# Patient Record
Sex: Female | Born: 1975 | Race: Black or African American | Hispanic: No | Marital: Married | Smoking: Never smoker
Health system: Southern US, Community
[De-identification: ages and names within clinical notes are randomized; demographics above are authoritative.]

---

## 1998-04-04 ENCOUNTER — Observation Stay (HOSPITAL_COMMUNITY): Admission: EM | Admit: 1998-04-04 | Discharge: 1998-04-05 | Payer: Self-pay | Admitting: Emergency Medicine

## 2000-09-22 ENCOUNTER — Emergency Department (HOSPITAL_COMMUNITY): Admission: EM | Admit: 2000-09-22 | Discharge: 2000-09-22 | Payer: Self-pay | Admitting: Emergency Medicine

## 2005-03-15 ENCOUNTER — Emergency Department (HOSPITAL_COMMUNITY): Admission: EM | Admit: 2005-03-15 | Discharge: 2005-03-15 | Payer: Self-pay | Admitting: *Deleted

## 2007-03-11 ENCOUNTER — Inpatient Hospital Stay (HOSPITAL_COMMUNITY): Admission: AD | Admit: 2007-03-11 | Discharge: 2007-03-11 | Payer: Self-pay | Admitting: Obstetrics and Gynecology

## 2007-03-12 ENCOUNTER — Inpatient Hospital Stay (HOSPITAL_COMMUNITY): Admission: AD | Admit: 2007-03-12 | Discharge: 2007-03-16 | Payer: Self-pay | Admitting: Obstetrics and Gynecology

## 2007-03-13 ENCOUNTER — Encounter (INDEPENDENT_AMBULATORY_CARE_PROVIDER_SITE_OTHER): Payer: Self-pay | Admitting: Obstetrics and Gynecology

## 2010-11-24 NOTE — Op Note (Signed)
Amanda Reese, Amanda Reese NO.:  [id number]   MEDICAL RECORD NO.:  [id number]          PATIENT TYPE:  INP   LOCATION:  9102                          FACILITY:  [facility]   PHYSICIAN:  Duke Salvia. Marcelle Overlie, M.D.DATE OF BIRTH:  April 09, 1976   DATE OF PROCEDURE:  03/13/2007  DATE OF DISCHARGE:                               OPERATIVE REPORT   PREOPERATIVE DIAGNOSIS:  Cephalopelvic disproportion.   POSTOPERATIVE DIAGNOSIS:  Cephalopelvic disproportion plus straight OP  presentation.   PROCEDURE:  Primary low transverse cesarean section.   SURGEON:  Duke Salvia. Marcelle Overlie, M.D.   ANESTHESIA:  Epidural.   COMPLICATIONS:  None.   DRAINS:  Foley catheter.   BLOOD LOSS:  800.   PROCEDURE AND FINDINGS:  The patient was taken to the operating room.  After an adequate level of epidural anesthesia was obtained with the  patient supine, the abdomen prepped and draped in the usual manner for  sterile abdominal procedures.  Foley catheter positioned draining clear  urine.  Transverse Pfannenstiel incision made two fingerbreadths below  the symphysis, carried down to the fascia which was incised and extended  transversely.  Rectus muscles divided midline.  Peritoneum entered  superiorly without incident and extended in a vertical fashion.  The  vesicouterine serosa was then incised and the bladder was bluntly and  sharply dissected below and a bladder blade repositioned.  Transverse  incision was then made in the lower segment, extended with blunt  dissection.  The infant was noted be straight OP, was elevated from the  pelvis and manually rotated to effect easy delivery of female, Apgars were  9 and 9.  The infant was suctioned, cord clamped and passed to pediatric  team for further care.  Cord pH was sent.  Placenta was removed manually  intact, uterus exteriorized, cavity wiped clean with laparotomy pack.  Closure obtained in first layer with 0 chromic in locked fashion  followed by  imbricating layer of 0 chromic.  This was hemostatic.  The  bladder flap area was intact and hemostatic.  Tubes and ovaries were  normal.  Prior to closure sponge, instrument counts were reported as  correct x2.  Perineum closed with a running 2-0 Vicryl suture.  Rectus  muscles closed with interrupted 2-0 Vicryl sutures.  Fascia closed from  laterally to midline on either side with a 0  PDS suture.  Subcutaneous fat was hemostatic.  Clips and Steri-Strips  used on the skin.  She did receive Ancef 1 gram IV after the cord was  clamped along Pitocin IV.  Clear urine noted at the end of the case.  Mother and baby doing well at that point.      Richard M. Marcelle Overlie, M.D.  Electronically Signed     RM[facility]/MEDQ  D:  03/13/2007  T:  03/13/2007  Job:  [id number]

## 2010-11-26 ENCOUNTER — Other Ambulatory Visit: Payer: Self-pay | Admitting: Obstetrics and Gynecology

## 2010-11-26 DIAGNOSIS — N6310 Unspecified lump in the right breast, unspecified quadrant: Secondary | ICD-10-CM

## 2010-11-27 NOTE — Discharge Summary (Signed)
Amanda Reese, DUTT NO.:  [id number]   MEDICAL RECORD NO.:  [id number]          PATIENT TYPE:  INP   LOCATION:  9102                          FACILITY:  [facility]   PHYSICIAN:  Michelle L. Grewal, M.D.DATE OF BIRTH:  February 02, 1976   DATE OF ADMISSION:  03/13/2007  DATE OF DISCHARGE:  03/16/2007                               DISCHARGE SUMMARY   ADMITTING DIAGNOSES:  1. Intrauterine pregnancy at term.  2. Spontaneous onset of labor.   DISCHARGE DIAGNOSES:  1. Status post low transverse cesarean section secondary to      cephalopelvic disproportion.  2. Viable female infant.   PROCEDURE:  Primary low transverse cesarean section.   REASON FOR ADMISSION:  Please see written H&P.   HOSPITAL COURSE:  The patient is a 34 year old black female gravida 3,  para 0 that was admitted to [facility] at 39-2/7 weeks  estimated gestational age.  The patient was seen in triage area prior to  admission and was noted to have early onset of contraction.  There was  some suspicion of variable decelerations which were corrected with  positional change.  On admission vital signs were stable.  She was  afebrile.  Cervix was noted to be 2 cm dilated, 70% effaced, vertex at -  3 station.  Fetal heart tones were reactive in the 140's.  The patient  was now admitted and was augmented with Pitocin.  The patient did  progress to complete dilatation with vertex at 0 station.  After pushing  for two hours the  patient made no further progress between +1 and +2  station.  Fetal heart tones continued to be reactive in the 150's.  Decision was made to proceed with a primary low transverse cesarean  section for cephalopelvic disproportion.  The patient was then  transferred to the operating room where spinal anesthesia was  administered without difficulty.  Low transverse incision was made with  delivery of a viable female infant weighing 6 pounds 9 ounces, Apgars 4 at  1 minute and 9 at  5 minutes.  Arterial cord pH of 7.32.  The patient  tolerated the procedure well and was taken to the recovery room in  stable condition.  On postoperative day #1 the patient was without  complaint.  Vital signs were stable.  She is afebrile.  Fundus was firm  and nontender.  Abdominal dressings noted to be clean, dry, and intact.  The patient was noted to have good urine output.  Hemoglobin was noted  to be 9.7.  On postoperative day #2 the patient was without complaint.  Vital signs were stable.  She is afebrile.  Abdomen soft.  Fundus firm  and nontender.  Incision was clean, dry, and intact.  She was ambulating  well and tolerating a regular diet without complaints of nausea and  vomiting.  Postoperative day #3 the patient was without complaint.  Vital signs were stable.  Abdomen soft.  Fundus firm and nontender.  Incision was clean, dry, and intact.  Stable vital signsand patient was  later discharged home.   CONDITION ON DISCHARGE:  Stable.  DIET:  Regular as tolerated.   ACTIVITY:  No heavy lifting, no driving x2 weeks, no vaginal entry.   FOLLOW UP:  The patient to follow up in the office in 1-2 weeks for  incision check.  She is to call for temperature greater than 100  degrees, persistent nausea, vomiting, heavy vaginal bleeding and/or  redness or drainage from incisional site.   DISCHARGE MEDICATIONS:  1. Percocet 5/325 #30 one p.o. every 4-6 hours p.r.n.  2. Motrin 600 mg every six hours.  3. Prenatal vitamins one p.o. daily.  4. Colace __________ daily p.r.n.      Julio Sicks, N.P.      Stann Mainland. Vincente Poli, M.D.  Electronically Signed    CC/MEDQ  D:  04/11/2007  T:  04/11/2007  Job:  [id number]   cc:   Marcelino Duster L. Vincente Poli, M.D.  Fax: [phone]

## 2010-12-02 ENCOUNTER — Ambulatory Visit
Admission: RE | Admit: 2010-12-02 | Discharge: 2010-12-02 | Disposition: A | Discharge status: Home / Self Care | Payer: BC Managed Care – PPO | Source: Ambulatory Visit | Attending: Obstetrics and Gynecology | Admitting: Obstetrics and Gynecology

## 2010-12-02 DIAGNOSIS — N6310 Unspecified lump in the right breast, unspecified quadrant: Secondary | ICD-10-CM

## 2011-04-23 LAB — CBC
HCT: 28.2 — ABNORMAL LOW
HCT: 33.9 — ABNORMAL LOW
Hemoglobin: 9.7 — ABNORMAL LOW
Hemoglobin: 11.8 — ABNORMAL LOW
MCHC: 34.7
MCV: 86.1
RBC: 3.27 — ABNORMAL LOW
RBC: 3.94
RDW: 14.4 — ABNORMAL HIGH

## 2011-04-23 LAB — RAPID HIV SCREEN (WH-MAU): Rapid HIV Screen: NONREACTIVE

## 2011-04-23 LAB — RPR: RPR Ser Ql: NONREACTIVE

## 2011-06-26 ENCOUNTER — Emergency Department (HOSPITAL_COMMUNITY)
Admission: EM | Admit: 2011-06-26 | Discharge: 2011-06-26 | Disposition: A | Discharge status: Home / Self Care | Payer: PRIVATE HEALTH INSURANCE | Attending: Emergency Medicine | Admitting: Emergency Medicine

## 2011-06-26 DIAGNOSIS — R6883 Chills (without fever): Secondary | ICD-10-CM | POA: Insufficient documentation

## 2011-06-26 DIAGNOSIS — B9789 Other viral agents as the cause of diseases classified elsewhere: Secondary | ICD-10-CM | POA: Insufficient documentation

## 2011-06-26 DIAGNOSIS — J029 Acute pharyngitis, unspecified: Secondary | ICD-10-CM | POA: Insufficient documentation

## 2011-06-26 DIAGNOSIS — R040 Epistaxis: Secondary | ICD-10-CM | POA: Insufficient documentation

## 2011-06-26 DIAGNOSIS — B349 Viral infection, unspecified: Secondary | ICD-10-CM

## 2011-06-26 DIAGNOSIS — IMO0001 Reserved for inherently not codable concepts without codable children: Secondary | ICD-10-CM | POA: Insufficient documentation

## 2011-06-26 DIAGNOSIS — R599 Enlarged lymph nodes, unspecified: Secondary | ICD-10-CM | POA: Insufficient documentation

## 2011-06-26 DIAGNOSIS — J3489 Other specified disorders of nose and nasal sinuses: Secondary | ICD-10-CM | POA: Insufficient documentation

## 2011-06-26 DIAGNOSIS — R0981 Nasal congestion: Secondary | ICD-10-CM

## 2011-06-26 MED ORDER — OXYMETAZOLINE HCL 0.05 % NA SOLN: 2.0000 | Freq: Two times a day (BID) | NASAL | Status: AC

## 2011-06-26 NOTE — ED Provider Notes (Signed)
History     CSN: [id number] Arrival date & time: 06/26/2011  7:55 AM   First MD Initiated Contact with Patient 06/26/11 [phone]      Chief Complaint  Patient presents with  . Sore Throat    (Consider location/radiation/quality/duration/timing/severity/associated sxs/prior treatment) HPI Comments: Patient reports 5 days ago she developed chills and body aches, sore throat, which has since improved. Patient has also had swollen anterior cervical lymph nodes and nasal congestion.  States she has blown her nose and has seen a few spots of blood and has had some continued spotting of blood following blowing her nose.  It was this epistaxis that concerned her and brought her to the ED.  She has tried a home humidifier and saline solution without improvement.  States her fever has resolved since 4 days ago and she denies headaches or facial pain, SOB.    Patient is a 35 y.o. female presenting with pharyngitis. The history is provided by the patient.  Sore Throat The current episode started in the past 7 days. The problem has been gradually improving.    History reviewed. No pertinent past medical history.  History reviewed. No pertinent past surgical history.  Family History  Problem Relation Age of Onset  . Diabetes Mother   . Hyperlipidemia Mother   . Diabetes Father   . Hyperlipidemia Father     History  Substance Use Topics  . Smoking status: Never Smoker   . Smokeless tobacco: Not on file  . Alcohol Use: 0.6 oz/week    1 Glasses of wine per week    OB History    Grav Para Term Preterm Abortions TAB SAB Ect Mult Living            1      Review of Systems  All other systems reviewed and are negative.    Allergies  Review of patient's allergies indicates no known allergies.  Home Medications   Current Outpatient Rx  Name Route Sig Dispense Refill  . DM-PHENYLEPHRINE-ACETAMINOPHEN 10-5-325 MG/15ML PO LIQD Oral Take 15 mLs by mouth 2 (two) times daily as needed. For  symptoms of flu, congestion, cough. Etc.     Marland Kitchen PSEUDOEPHEDRINE-APAP-DM 56-213-08 MG/30ML PO LIQD Oral Take 15 mLs by mouth 2 (two) times daily as needed. For congestion, cough, symptoms of flu.       BP 119/73  Pulse 87  Temp(Src) 97.7 F (36.5 C) (Oral)  Resp 18  Ht 5\' 9"  (1.753 m)  Wt 140 lb (63.504 kg)  BMI 20.67 kg/m2  SpO2 100%  LMP 06/13/2011  Physical Exam  Nursing note and vitals reviewed. Constitutional: She is oriented to person, place, and time. She appears well-developed and well-nourished.  HENT:  Head: Normocephalic and atraumatic.  Nose: Mucosal edema present. No nasal deformity or nasal septal hematoma. No epistaxis. Right sinus exhibits no maxillary sinus tenderness and no frontal sinus tenderness. Left sinus exhibits no maxillary sinus tenderness and no frontal sinus tenderness.  Mouth/Throat: Uvula is midline and mucous membranes are normal. No uvula swelling. Posterior oropharyngeal erythema present. No oropharyngeal exudate, posterior oropharyngeal edema or tonsillar abscesses.       +anterior cervical lymph nodes slightly enlarged and tender  Neck: Trachea normal, normal range of motion and phonation normal. Neck supple. No tracheal tenderness present. No rigidity. No tracheal deviation and normal range of motion present.  Cardiovascular: Normal rate and regular rhythm.   Pulmonary/Chest: Effort normal and breath sounds normal. No stridor.  Neurological: She is alert  and oriented to person, place, and time.    ED Course  Procedures (including critical care time)  Labs Reviewed - No data to display No results found.   1. Viral illness   2. Nasal congestion       MDM  Patient with viral illness this week, much improved, pt well appearing and afebrile on presentation to ED, concerned about blood in nasal discharge and mild epistaxis, resolved with slight pressure lasting seconds.  No active bleeding, sinuses nontender.          Dillard Cannon [place],  [place] 06/26/11 [phone]

## 2011-06-26 NOTE — ED Notes (Signed)
Flu-like symptoms began on Tuesday and then Thursday developed, sore throat and nasal congestion

## 2011-06-26 NOTE — ED Provider Notes (Signed)
Medical screening examination/treatment/procedure(s) were performed by non-physician practitioner and as supervising physician I was immediately available for consultation/collaboration.   Kishon Garriga R Faustina Gebert, MD 06/26/11 0930 

## 2014-04-30 ENCOUNTER — Ambulatory Visit: Payer: Self-pay | Admitting: Obstetrics and Gynecology

## 2015-05-08 ENCOUNTER — Other Ambulatory Visit: Payer: Self-pay | Admitting: Obstetrics and Gynecology

## 2015-05-08 DIAGNOSIS — Z1231 Encounter for screening mammogram for malignant neoplasm of breast: Secondary | ICD-10-CM

## 2015-05-13 ENCOUNTER — Ambulatory Visit
Admission: RE | Admit: 2015-05-13 | Discharge: 2015-05-13 | Disposition: A | Discharge status: Home / Self Care | Payer: BLUE CROSS/BLUE SHIELD | Source: Ambulatory Visit | Attending: Obstetrics and Gynecology | Admitting: Obstetrics and Gynecology

## 2015-05-13 DIAGNOSIS — Z1231 Encounter for screening mammogram for malignant neoplasm of breast: Secondary | ICD-10-CM | POA: Diagnosis present

## 2015-07-31 ENCOUNTER — Encounter: Payer: Self-pay | Admitting: Emergency Medicine

## 2015-07-31 ENCOUNTER — Emergency Department
Admission: EM | Admit: 2015-07-31 | Discharge: 2015-07-31 | Disposition: A | Discharge status: Home / Self Care | Payer: BLUE CROSS/BLUE SHIELD | Attending: Emergency Medicine | Admitting: Emergency Medicine

## 2015-07-31 ENCOUNTER — Emergency Department: Payer: BLUE CROSS/BLUE SHIELD

## 2015-07-31 DIAGNOSIS — R0602 Shortness of breath: Secondary | ICD-10-CM | POA: Diagnosis not present

## 2015-07-31 DIAGNOSIS — R091 Pleurisy: Secondary | ICD-10-CM | POA: Diagnosis present

## 2015-07-31 DIAGNOSIS — R0789 Other chest pain: Secondary | ICD-10-CM | POA: Insufficient documentation

## 2015-07-31 LAB — BASIC METABOLIC PANEL
ANION GAP: 8 (ref 5–15)
BUN: 11 mg/dL (ref 6–20)
CALCIUM: 9.5 mg/dL (ref 8.9–10.3)
CO2: 27 mmol/L (ref 22–32)
Chloride: 103 mmol/L (ref 101–111)
Creatinine, Ser: 0.84 mg/dL (ref 0.44–1.00)
GFR calc Af Amer: >60 mL/min (ref >60)
GFR calc non Af Amer: >60 mL/min (ref >60)
GLUCOSE: 113 mg/dL — AB (ref 65–99)
Potassium: 3.5 mmol/L (ref 3.5–5.1)
Sodium: 138 mmol/L (ref 135–145)

## 2015-07-31 LAB — CBC
HCT: 42.2 % (ref 35.0–47.0)
HEMOGLOBIN: 13.7 g/dL (ref 12.0–16.0)
MCH: 28.7 pg (ref 26.0–34.0)
MCHC: 32.6 g/dL (ref 32.0–36.0)
MCV: 88 fL (ref 80.0–100.0)
PLATELETS: 273 10*3/uL (ref 150–440)
RBC: 4.79 MIL/uL (ref 3.80–5.20)
RDW: 13.9 % (ref 11.5–14.5)
WBC: 4.2 10*3/uL (ref 3.6–11.0)

## 2015-07-31 LAB — TROPONIN I: Troponin I: <0.03 ng/mL (ref <0.031)

## 2015-07-31 MED ORDER — CYCLOBENZAPRINE HCL 10 MG PO TABS: 5.0000 mg | ORAL_TABLET | Freq: Once | ORAL | Status: DC

## 2015-07-31 MED ORDER — IBUPROFEN 600 MG PO TABS
600.0000 mg | ORAL_TABLET | Freq: Once | ORAL | Status: AC
  Administered 2015-07-31: 600 mg via ORAL
  Filled 2015-07-31: qty 1

## 2015-07-31 MED ORDER — IBUPROFEN 600 MG PO TABS: 600.0000 mg | ORAL_TABLET | Freq: Three times a day (TID) | ORAL | Status: DC | PRN

## 2015-07-31 MED ORDER — CYCLOBENZAPRINE HCL 5 MG PO TABS: 5.0000 mg | ORAL_TABLET | Freq: Three times a day (TID) | ORAL | Status: AC | PRN

## 2015-07-31 NOTE — ED Notes (Signed)
States she developed left sided chest pain this am after getting up   Pain/pressure increases with inspiration

## 2015-07-31 NOTE — Discharge Instructions (Signed)
Take motrin every 6 hrs for pain.  Take flexeril for severe pain.  See your doctor.   Avoid heavy lifting for several days. No weight lifting until you feel better.   Return to ER if you have severe chest pain, shortness of breath.

## 2015-07-31 NOTE — ED Provider Notes (Signed)
CSN: [id number]     Arrival date & time 07/31/15  1125 History   First MD Initiated Contact with Patient 07/31/15 1333     Chief Complaint  Patient presents with  . Pleurisy     (Consider location/radiation/quality/duration/timing/severity/associated sxs/prior Treatment) The history is provided by the patient.  Amanda Reese is a 40 y.o. female otherwise healthy here presenting with chest pain, shortness of breath. Patient states that she is a very active person and does weight lifting. She lifted a 30 pound weight yesterday. She woke up this morning with left-sided chest pain that is worse with movement and worse with deep inspiration. She has minimal shortness of breath associated with movement but not at rest. Denies any leg swelling or recent travels or history of DVT or PE. Denies any cardiac history. She's never smoker.   History reviewed. No pertinent past medical history. History reviewed. No pertinent past surgical history. Family History  Problem Relation Age of Onset  . Diabetes Mother   . Hyperlipidemia Mother   . Diabetes Father   . Hyperlipidemia Father    Social History  Substance Use Topics  . Smoking status: Never Smoker   . Smokeless tobacco: None  . Alcohol Use: 0.6 oz/week    1 Glasses of wine per week   OB History    Gravida Para Term Preterm AB TAB SAB Ectopic Multiple Living            1     Review of Systems  Respiratory: Positive for shortness of breath.   Cardiovascular: Positive for chest pain.  All other systems reviewed and are negative.     Allergies  Review of patient's allergies indicates no known allergies.  Home Medications   Prior to Admission medications   Medication Sig Start Date End Date Taking? Authorizing Provider  DM-Phenylephrine-Acetaminophen (THERAFLU WARMING RELIEF DAY) 10-5-325 MG/15ML LIQD Take 15 mLs by mouth 2 (two) times daily as needed. For symptoms of flu, congestion, cough. Etc.     Historical Provider, MD   Pseudoephedrine-APAP-DM (DAYQUIL MULTI-SYMPTOM) 40-981-19 MG/30ML LIQD Take 15 mLs by mouth 2 (two) times daily as needed. For congestion, cough, symptoms of flu.     Historical Provider, MD   BP 129/76 mmHg  Pulse 95  Temp(Src) 98.1 F (36.7 C) (Oral)  Resp 20  Ht  (1.778 m)  Wt 130 lb (58.968 kg)  BMI 18.65 kg/m2  SpO2 100%  LMP 07/18/2015 Physical Exam  Constitutional: She is oriented to person, place, and time. She appears well-developed.  HENT:  Head: Normocephalic.  Mouth/Throat: Oropharynx is clear and moist.  Eyes: Conjunctivae are normal. Pupils are equal, round, and reactive to light.  Neck: Normal range of motion. Neck supple.  Cardiovascular: Normal rate, regular rhythm and normal heart sounds.   Pulmonary/Chest: Effort normal and breath sounds normal. No respiratory distress. She has no wheezes. She has no rales.  + left chest tenderness, reproducible   Abdominal: Soft. Bowel sounds are normal. She exhibits no distension. There is no tenderness. There is no rebound.  Musculoskeletal: Normal range of motion. She exhibits no edema or tenderness.  Neurological: She is alert and oriented to person, place, and time.  Skin: Skin is warm.  Psychiatric: She has a normal mood and affect. Her behavior is normal. Judgment and thought content normal.  Nursing note and vitals reviewed.   ED Course  Procedures (including critical care time) Labs Review Labs Reviewed  BASIC METABOLIC PANEL - Abnormal; Notable for the  following:    Glucose, Bld 113 (*)    All other components within normal limits  TROPONIN I  CBC    Imaging Review Dg Chest 2 View  07/31/2015  CLINICAL DATA:  Left-sided chest pain today. EXAM: CHEST  2 VIEW COMPARISON:  None. FINDINGS: The cardiomediastinal silhouette is within normal limits. The lungs are well inflated and clear. There is no evidence of pleural effusion or pneumothorax. No acute osseous abnormality is identified. IMPRESSION: No active  cardiopulmonary disease. Electronically Signed   By: Sebastian Ache M.D.   On: 07/31/2015 12:10   I have personally reviewed and evaluated these images and lab results as part of my medical decision-making.   EKG Interpretation None       ED ECG REPORT I, Amanda Reese, the attending physician, personally viewed and interpreted this ECG.   Date: 07/31/2015  EKG Time:11:33  Rate: 97  Rhythm: normal EKG, normal sinus rhythm  Axis: normal  Intervals:none  ST&T Change: nonspecific    MDM   Final diagnoses:  None    Amanda Reese is a 40 y.o. female here with chest pain, reproducible after weight lifting. Low risk for ACS or PE. Not tachy or hypoxic. Well appearing. Will dc home with motrin, flexeril. No heavy lifting.    Amanda Canal, MD 07/31/15 [phone]

## 2016-05-04 ENCOUNTER — Other Ambulatory Visit: Payer: Self-pay | Admitting: Obstetrics and Gynecology

## 2016-05-04 DIAGNOSIS — Z1231 Encounter for screening mammogram for malignant neoplasm of breast: Secondary | ICD-10-CM

## 2016-05-20 ENCOUNTER — Ambulatory Visit
Admission: RE | Admit: 2016-05-20 | Discharge: 2016-05-20 | Disposition: A | Discharge status: Home / Self Care | Payer: BLUE CROSS/BLUE SHIELD | Source: Ambulatory Visit | Attending: Obstetrics and Gynecology | Admitting: Obstetrics and Gynecology

## 2016-05-20 DIAGNOSIS — Z1231 Encounter for screening mammogram for malignant neoplasm of breast: Secondary | ICD-10-CM | POA: Diagnosis present

## 2017-05-13 IMAGING — MG MM DIGITAL SCREENING BILATERAL
5 series · 5 of 5 positions shown · non-contrast
Comparison: Previous exam(s).

CLINICAL DATA: Screening.

EXAM:
DIGITAL SCREENING BILATERAL MAMMOGRAM WITH CAD

[L CC (1 of 2)]
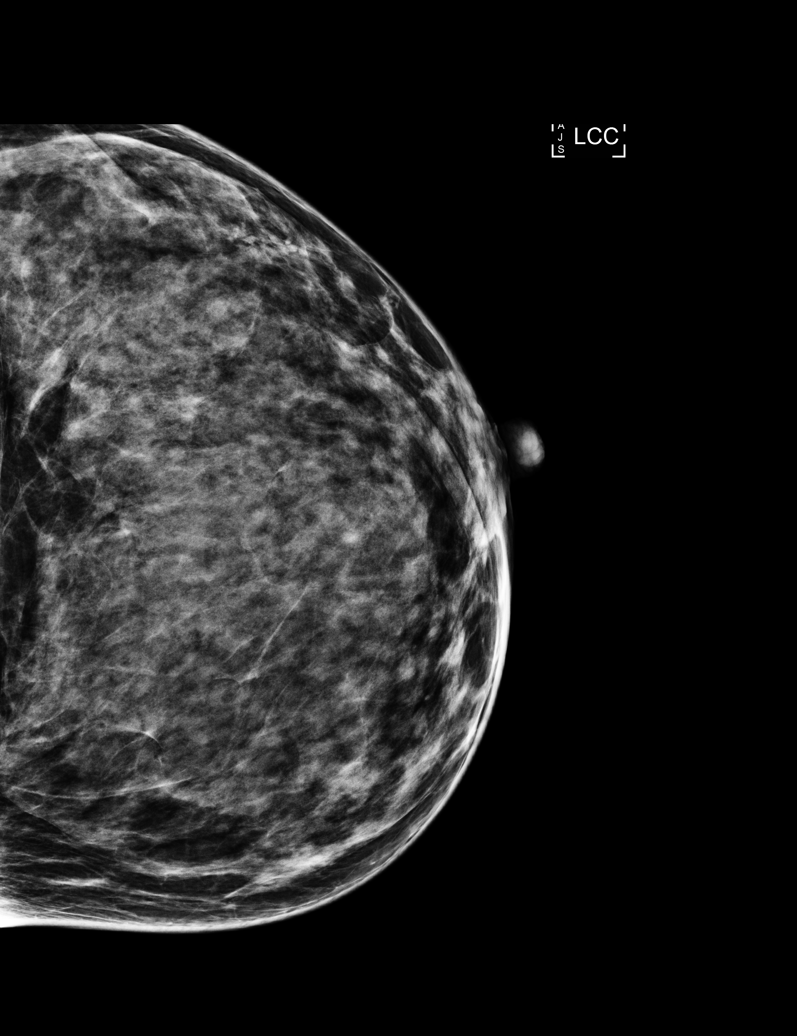

[R CC]
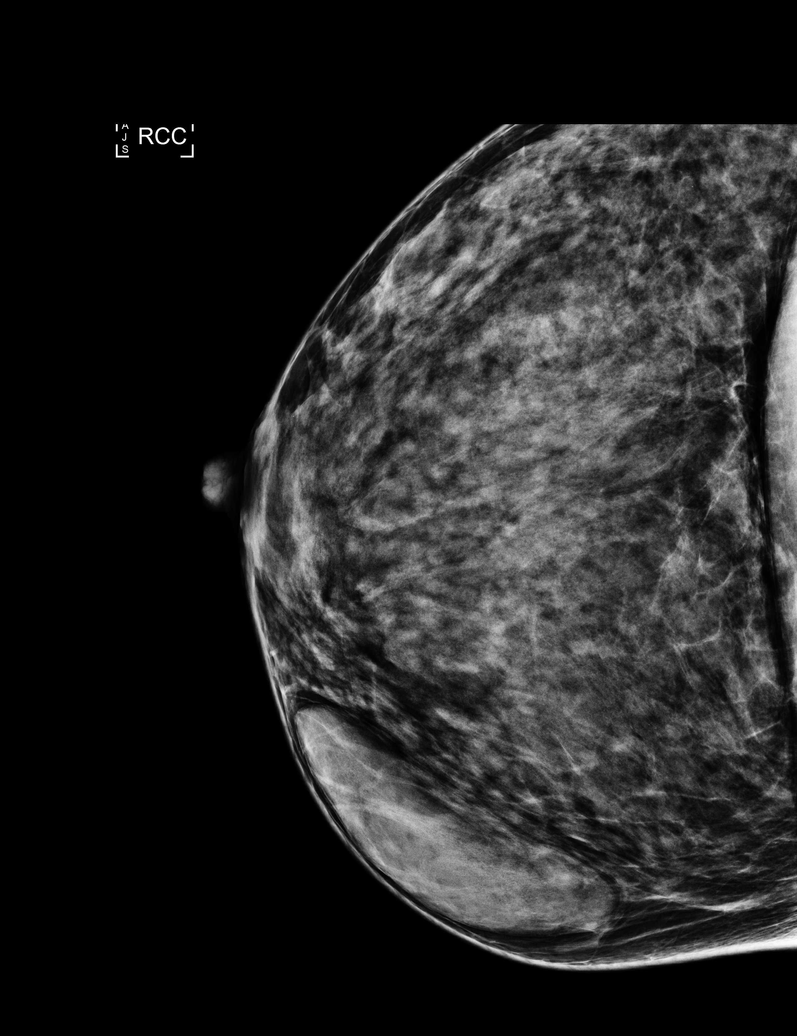

[L MLO]
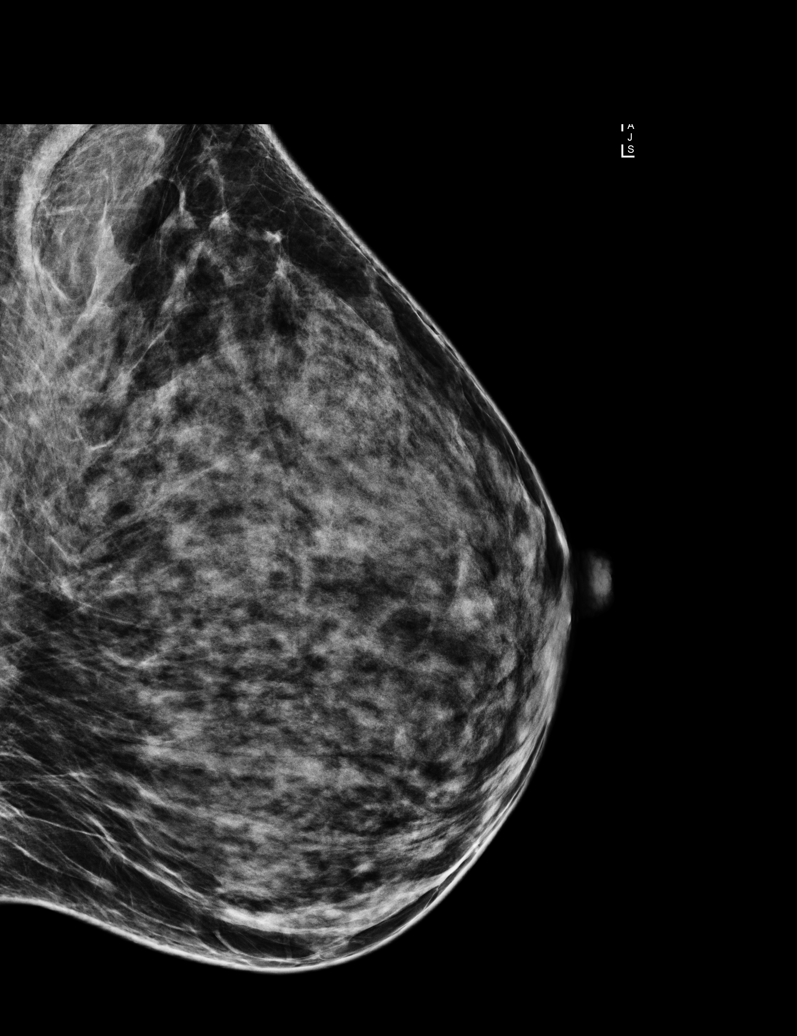

[L CC (2 of 2)]
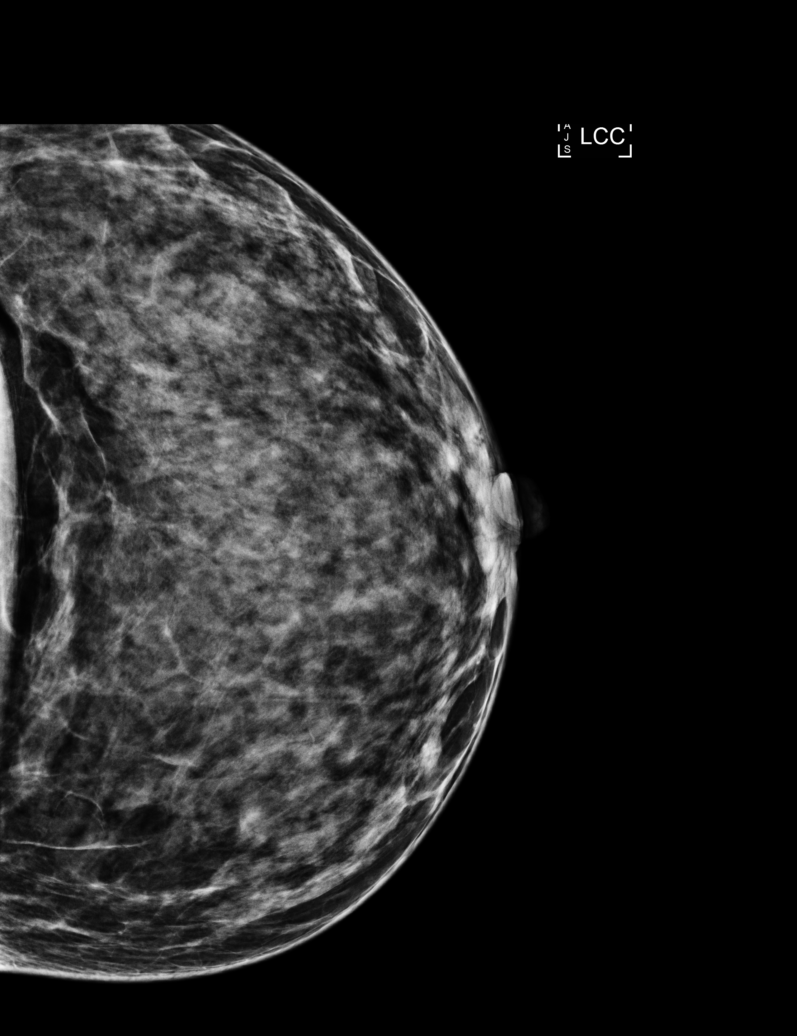

[R MLO]
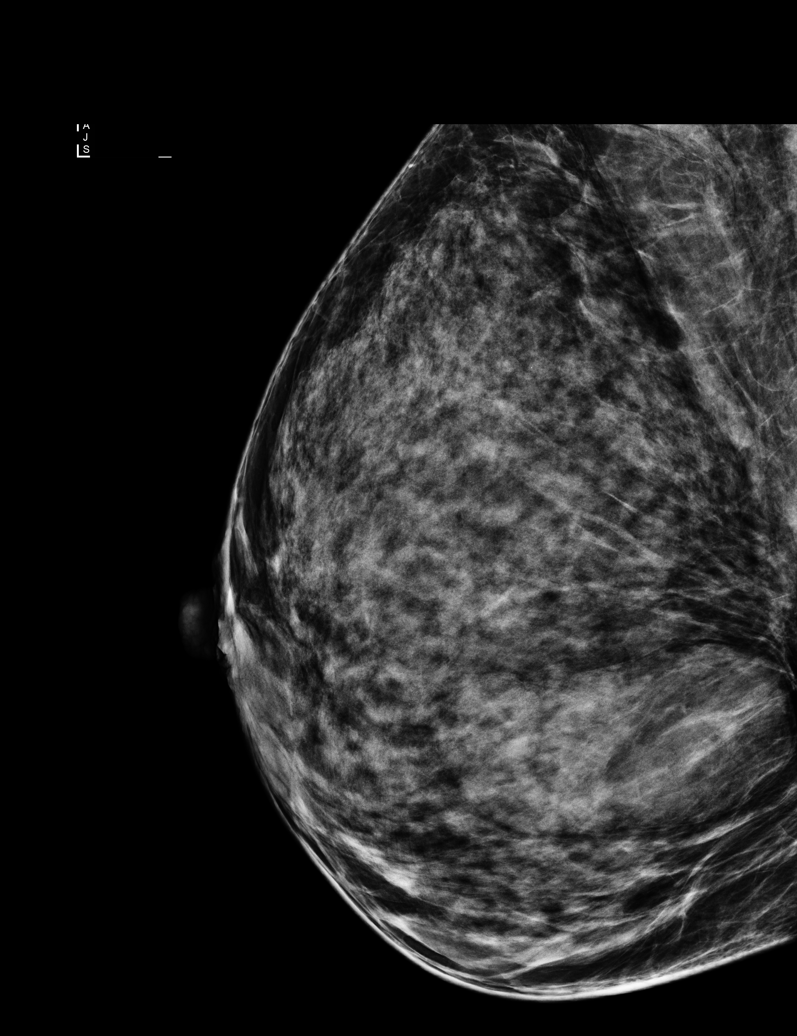

[5 of 5 positions shown; findings below may reference images not displayed]

ACR Breast Density Category d: The breast tissue is extremely dense,
which lowers the sensitivity of mammography.
FINDINGS: There are no findings suspicious for malignancy. Images were
processed with CAD.
IMPRESSION: No mammographic evidence of malignancy. A result letter of this
screening mammogram will be mailed directly to the patient.

RECOMMENDATION:
Screening mammogram in one year. (Code:BD-D-K0F)

BI-RADS CATEGORY  1: Negative.

## 2017-05-16 ENCOUNTER — Other Ambulatory Visit: Payer: Self-pay | Admitting: Obstetrics and Gynecology

## 2017-05-16 DIAGNOSIS — Z1231 Encounter for screening mammogram for malignant neoplasm of breast: Secondary | ICD-10-CM

## 2017-05-25 ENCOUNTER — Ambulatory Visit
Admission: RE | Admit: 2017-05-25 | Discharge: 2017-05-25 | Disposition: A | Discharge status: Home / Self Care | Payer: BLUE CROSS/BLUE SHIELD | Source: Ambulatory Visit | Attending: Obstetrics and Gynecology | Admitting: Obstetrics and Gynecology

## 2017-05-25 DIAGNOSIS — Z1231 Encounter for screening mammogram for malignant neoplasm of breast: Secondary | ICD-10-CM

## 2020-03-29 ENCOUNTER — Other Ambulatory Visit: Payer: Self-pay

## 2020-03-29 ENCOUNTER — Encounter: Payer: Self-pay | Admitting: *Deleted

## 2020-03-29 ENCOUNTER — Ambulatory Visit
Admission: EM | Admit: 2020-03-29 | Discharge: 2020-03-29 | Disposition: A | Discharge status: Home / Self Care | Payer: BC Managed Care – PPO | Attending: Physician Assistant | Admitting: Physician Assistant

## 2020-03-29 DIAGNOSIS — R0789 Other chest pain: Secondary | ICD-10-CM

## 2020-03-29 DIAGNOSIS — R05 Cough: Secondary | ICD-10-CM

## 2020-03-29 DIAGNOSIS — M7918 Myalgia, other site: Secondary | ICD-10-CM

## 2020-03-29 DIAGNOSIS — R059 Cough, unspecified: Secondary | ICD-10-CM

## 2020-03-29 MED ORDER — FAMOTIDINE 20 MG PO TABS: 20.0000 mg | ORAL_TABLET | Freq: Two times a day (BID) | ORAL | 0 refills | Status: AC

## 2020-03-29 NOTE — Discharge Instructions (Addendum)
COVID PCR testing ordered. I would like you to quarantine until testing results. Start pepcid as directed for possible acid reflux causing symptoms. As discussed, cannot rule out blood clot to the buttock/lung causing symptoms. If symptoms worsening, please go to the emergency department for further evaluation. Otherwise, please follow up with PCP for further evaluation if symptoms not resolving.

## 2020-03-29 NOTE — ED Provider Notes (Signed)
[facility]    CSN: [id number] Arrival date & time: 03/29/20  4970      History   Chief Complaint No chief complaint on file.   HPI Amanda Reese is a 44 y.o. female.   44 year old female comes in for 1 week history of intermittent chest pain and cough. At first thought it was due to gas, decrease fiber in diet, and improved symptoms. Pain normally to the left chest, lasting seconds to possibly 1 min. Noticed some worsening with bending over. Had some short of breath that has completely resolved. No dyspnea on exertion.  Has continued to exercise that improved symptoms versus worsened.  Denies abdominal pain, nausea, vomiting.  Denies rhinorrhea, nasal congestion.  Denies fever, chills, body aches.  Patient also noted to have left buttock pain starting 1 month ago after a 6-hour drive.  Has not noticed any leg swelling.  States noticed pain with long hours of sitting, but pain can be resolved by ambulating.  Denies erythema, warmth to the legs.  Denies OCP use, recent surgery, history of blood clots.     History reviewed. No pertinent past medical history.  There are no problems to display for this patient.   History reviewed. No pertinent surgical history.  OB History    Gravida      Para      Term      Preterm      AB      Living  1     SAB      TAB      Ectopic      Multiple      Live Births               Home Medications    Prior to Admission medications   Medication Sig Start Date End Date Taking? Authorizing Provider  famotidine (PEPCID) 20 MG tablet Take 1 tablet (20 mg total) by mouth 2 (two) times daily. 03/29/20   Belinda Fisher, PA-C    Family History Family History  Problem Relation Age of Onset  . Diabetes Mother   . Hyperlipidemia Mother   . Diabetes Father   . Hyperlipidemia Father     Social History Social History   Tobacco Use  . Smoking status: Never Smoker  . Smokeless tobacco: Never Used  Substance Use  Topics  . Alcohol use: Yes    Alcohol/week: 1.0 standard drink    Types: 1 Glasses of wine per week    Comment: occ  . Drug use: No     Allergies   Patient has no known allergies.   Review of Systems Review of Systems  Reason unable to perform ROS: See HPI as above.     Physical Exam Triage Vital Signs ED Triage Vitals  Enc Vitals Group     BP 03/29/20 0949 139/87     Pulse Rate 03/29/20 0949 94     Resp 03/29/20 0949 17     Temp 03/29/20 0949 98 F (36.7 C)     Temp src --      SpO2 03/29/20 0949 98 %     Weight --      Height --      Head Circumference --      Peak Flow --      Pain Score 03/29/20 0950 2     Pain Loc --      Pain Edu? --      Excl. in GC? --  No data found.  Updated Vital Signs BP 139/87 (BP Location: Right Arm)   Pulse 94   Temp 98 F (36.7 C)   Resp 17   LMP 03/09/2020 (Approximate)   SpO2 98%   Visual Acuity Right Eye Distance:   Left Eye Distance:   Bilateral Distance:    Right Eye Near:   Left Eye Near:    Bilateral Near:     Physical Exam Constitutional:      General: She is not in acute distress.    Appearance: Normal appearance. She is not ill-appearing, toxic-appearing or diaphoretic.  HENT:     Head: Normocephalic and atraumatic.     Mouth/Throat:     Mouth: Mucous membranes are moist.     Pharynx: Oropharynx is clear. Uvula midline.  Cardiovascular:     Rate and Rhythm: Normal rate and regular rhythm.     Heart sounds: Normal heart sounds. No murmur heard.  No friction rub. No gallop.   Pulmonary:     Effort: Pulmonary effort is normal. No accessory muscle usage, prolonged expiration, respiratory distress or retractions.     Comments: Lungs clear to auscultation without adventitious lung sounds. Chest:     Comments: No tenderness to palpation of chest. Musculoskeletal:     Cervical back: Normal range of motion and neck supple.     Comments: Pain not reproducible by palpation. Full ROM of hip. No leg  swelling noted.   Skin:    General: Skin is warm and dry.  Neurological:     General: No focal deficit present.     Mental Status: She is alert and oriented to person, place, and time.      UC Treatments / Results  Labs (all labs ordered are listed, but only abnormal results are displayed) Labs Reviewed  NOVEL CORONAVIRUS, NAA    EKG   Radiology No results found.  Procedures Procedures (including critical care time)  Medications Ordered in UC Medications - No data to display  Initial Impression / Assessment and Plan / UC Course  I have reviewed the triage vital signs and the nursing notes.  Pertinent labs & imaging results that were available during my care of the patient were reviewed by me and considered in my medical decision making (see chart for details).    EKG NSR, 90 bpm, no significant ST changes.  Discussed given patient with left buttock pain after 6-hour drive, now with intermittent chest pain, cannot rule out DVT/PE causing symptoms.  However, patient without significant signs of DVT.  No leg swelling, erythema, pain.  Patient also without tachycardia, tachypnea, hypoxia.  Chest pain is intermittent, and improved by ambulation. Will trial pepcid for possible GERD causing symptoms. To follow up with PCP if symptoms not improving. If symptoms worsening, to go to the ED for further evaluation. Other return precautions given.  Final Clinical Impressions(s) / UC Diagnoses   Final diagnoses:  Cough  Atypical chest pain  Left buttock pain   ED Prescriptions    Medication Sig Dispense Auth. Provider   famotidine (PEPCID) 20 MG tablet Take 1 tablet (20 mg total) by mouth 2 (two) times daily. 14 tablet Belinda Fisher, PA-C     PDMP not reviewed this encounter.   Belinda Fisher, PA-C 03/29/20 [phone]

## 2020-03-29 NOTE — ED Triage Notes (Signed)
Pt stated--chest pain x 1 week and also reports left buttock pain X 1 month. Tried tylenol, mild relief.

## 2020-03-31 LAB — SARS-COV-2, NAA 2 DAY TAT

## 2020-03-31 LAB — NOVEL CORONAVIRUS, NAA: SARS-CoV-2, NAA: NOT DETECTED
